# Patient Record
Sex: Male | Born: 1995 | Race: White | Hispanic: No | Marital: Single | Smoking: Never smoker
Health system: Southern US, Community
[De-identification: ages and names within clinical notes are randomized; demographics above are authoritative.]

## PROBLEM LIST (undated history)

## (undated) DIAGNOSIS — F419 Anxiety disorder, unspecified: Secondary | ICD-10-CM

## (undated) DIAGNOSIS — M549 Dorsalgia, unspecified: Secondary | ICD-10-CM

## (undated) DIAGNOSIS — F1910 Other psychoactive substance abuse, uncomplicated: Secondary | ICD-10-CM

## (undated) HISTORY — DX: Dorsalgia, unspecified: M54.9

## (undated) HISTORY — PX: FRACTURE SURGERY: SHX138

## (undated) HISTORY — PX: FOOT SURGERY: SHX648

## (undated) HISTORY — PX: WRIST FRACTURE SURGERY: SHX121

## (undated) HISTORY — DX: Other psychoactive substance abuse, uncomplicated: F19.10

## (undated) HISTORY — DX: Anxiety disorder, unspecified: F41.9

---
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
## 2015-06-25 ENCOUNTER — Encounter: Payer: Self-pay | Admitting: Neurology

## 2015-06-25 ENCOUNTER — Ambulatory Visit (INDEPENDENT_AMBULATORY_CARE_PROVIDER_SITE_OTHER): Admitting: Neurology

## 2015-06-25 VITALS — BP 134/76 | HR 98 | Resp 16 | Ht 71.0 in | Wt 178.0 lb

## 2015-06-25 DIAGNOSIS — G4710 Hypersomnia, unspecified: Secondary | ICD-10-CM | POA: Diagnosis not present

## 2015-06-25 DIAGNOSIS — G2581 Restless legs syndrome: Secondary | ICD-10-CM | POA: Diagnosis not present

## 2015-06-25 DIAGNOSIS — G478 Other sleep disorders: Secondary | ICD-10-CM
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
## 2015-06-25 NOTE — Patient Instructions (Signed)
We will do sleep study testing including a daytime sleep study.  On the day of your night time sleep study, please come between 8 and 5 for your urine drug test.  Please do not drive when sleepy.  Please remember to try to maintain good sleep hygiene, which means: Keep a regular sleep and wake schedule, try not to exercise or have a meal within 2 hours of your bedtime, try to keep your bedroom conducive for sleep, that is, cool and dark, without light distractors such as an illuminated alarm clock, and refrain from watching TV right before sleep or in the middle of the night and do not keep the TV or radio on during the night. Also, try not to use or play on electronic devices at bedtime, such as your cell phone, tablet PC or laptop. If you like to read at bedtime on an electronic device, try to dim the background light as much as possible. Do not eat in the middle of the night.

## 2015-07-08 ENCOUNTER — Other Ambulatory Visit: Payer: Self-pay

## 2015-07-08 DIAGNOSIS — G478 Other sleep disorders: Secondary | ICD-10-CM

## 2015-07-08 DIAGNOSIS — G4710 Hypersomnia, unspecified: Secondary | ICD-10-CM

## 2015-07-08 DIAGNOSIS — G2581 Restless legs syndrome: Secondary | ICD-10-CM

## 2015-11-07 ENCOUNTER — Encounter (HOSPITAL_COMMUNITY): Payer: Self-pay | Admitting: Physical Medicine and Rehabilitation

## 2015-11-07 ENCOUNTER — Emergency Department (HOSPITAL_COMMUNITY)
Admission: EM | Admit: 2015-11-07 | Discharge: 2015-11-07 | Disposition: A | Discharge status: Home / Self Care | Attending: Emergency Medicine | Admitting: Emergency Medicine

## 2015-11-07 DIAGNOSIS — G4700 Insomnia, unspecified: Secondary | ICD-10-CM | POA: Insufficient documentation

## 2015-11-07 DIAGNOSIS — F11282 Opioid dependence with opioid-induced sleep disorder: Secondary | ICD-10-CM | POA: Insufficient documentation

## 2015-11-07 DIAGNOSIS — R55 Syncope and collapse: Secondary | ICD-10-CM | POA: Diagnosis not present

## 2015-11-07 DIAGNOSIS — F1910 Other psychoactive substance abuse, uncomplicated: Secondary | ICD-10-CM

## 2015-11-07 DIAGNOSIS — G4710 Hypersomnia, unspecified: Secondary | ICD-10-CM

## 2015-11-07 DIAGNOSIS — R4020 Unspecified coma: Secondary | ICD-10-CM

## 2015-11-07 LAB — CBC WITH DIFFERENTIAL/PLATELET
Basophils Absolute: 0 10*3/uL (ref 0.0–0.1)
Basophils Relative: 0 %
EOS ABS: 0.1 10*3/uL (ref 0.0–0.7)
EOS PCT: 1 %
HCT: 46.8 % (ref 39.0–52.0)
Hemoglobin: 15.9 g/dL (ref 13.0–17.0)
LYMPHS ABS: 1.6 10*3/uL (ref 0.7–4.0)
LYMPHS PCT: 15 %
MCH: 29.9 pg (ref 26.0–34.0)
MCHC: 34 g/dL (ref 30.0–36.0)
MCV: 88 fL (ref 78.0–100.0)
MONO ABS: 0.4 10*3/uL (ref 0.1–1.0)
MONOS PCT: 4 %
Neutro Abs: 8.9 10*3/uL — ABNORMAL HIGH (ref 1.7–7.7)
Neutrophils Relative %: 80 %
PLATELETS: 314 10*3/uL (ref 150–400)
RBC: 5.32 MIL/uL (ref 4.22–5.81)
RDW: 12.7 % (ref 11.5–15.5)
WBC: 11 10*3/uL — AB (ref 4.0–10.5)

## 2015-11-07 LAB — COMPREHENSIVE METABOLIC PANEL
ALT: 24 U/L (ref 17–63)
ANION GAP: 9 (ref 5–15)
AST: 21 U/L (ref 15–41)
Albumin: 4.4 g/dL (ref 3.5–5.0)
Alkaline Phosphatase: 61 U/L (ref 38–126)
BUN: 10 mg/dL (ref 6–20)
CHLORIDE: 101 mmol/L (ref 101–111)
CO2: 27 mmol/L (ref 22–32)
CREATININE: 0.79 mg/dL (ref 0.61–1.24)
Calcium: 9.7 mg/dL (ref 8.9–10.3)
Glucose, Bld: 93 mg/dL (ref 65–99)
Potassium: 3.9 mmol/L (ref 3.5–5.1)
SODIUM: 137 mmol/L (ref 135–145)
Total Bilirubin: 1.3 mg/dL — ABNORMAL HIGH (ref 0.3–1.2)
Total Protein: 7.7 g/dL (ref 6.5–8.1)

## 2015-11-07 LAB — I-STAT TROPONIN, ED
TROPONIN I, POC: 0 ng/mL (ref 0.00–0.08)
TROPONIN I, POC: 0 ng/mL (ref 0.00–0.08)

## 2015-11-07 LAB — RAPID URINE DRUG SCREEN, HOSP PERFORMED
Amphetamines: NOT DETECTED
BARBITURATES: NOT DETECTED
BENZODIAZEPINES: POSITIVE — AB
COCAINE: NOT DETECTED
Opiates: POSITIVE — AB
Tetrahydrocannabinol: POSITIVE — AB

## 2015-11-07 LAB — ETHANOL

## 2015-11-07 MED ORDER — SODIUM CHLORIDE 0.9 % IV BOLUS (SEPSIS)
1000.0000 mL | Freq: Once | INTRAVENOUS | Status: AC
  Administered 2015-11-07: 1000 mL via INTRAVENOUS
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
## 2015-11-07 NOTE — Discharge Instructions (Signed)
Please do not drive until you are cleared by neurology.   Hypersomnia Hypersomnia is when you feel extremely tired during the day even though you're getting plenty of sleep at night. You may need to take naps during the day, and you may also be extremely difficult to wake up when you are sleeping.  CAUSES  The cause of your hypersomnia may not be known. Hypersomnia may be caused by:   Medicines.  Sleep disorders, such as narcolepsy.  Trauma or injury to your head or nervous system.  Using drugs or alcohol.  Tumors.  Medical conditions, such as depression or hypothyroidism.  Genetics. SIGNS AND SYMPTOMS  The main symptoms of hypersomnia include:   Feeling extremely tired throughout the day.  Being very difficult to wake up.  Sleeping for longer and longer periods.  Taking naps throughout the day. Other symptoms may include:   Feeling:  Restless.  Annoyed.  Anxious.  Low energy.  Having difficulty:  Remembering.  Speaking.  Thinking.  Losing your appetite.  Experiencing hallucinations. DIAGNOSIS  Hypersomnia may be diagnosed by:  Medical history and physical exam. This will include a sleep history.  Completing sleep logs.  Tests may also be done, such as:  Polysomnography.  Multiple sleep latency test (MSLT). TREATMENT  There is no cure for hypersomnia, but treatment can be very effective in helping manage the condition. Treatment may include:  Lifestyle and sleeping strategies to help cope with the condition.  Stimulant medicines.  Treating any underlying causes of hypersomnia. HOME CARE INSTRUCTIONS  Take medicines only as directed by your health care provider.  Schedule short naps for when you feel sleepiest during the day. Tell your employer or teachers that you have hypersomnia. You may be able to adjust your schedule to include time for naps.  Avoid drinking alcohol or caffeinated beverages.  Do not eat a heavy meal before bedtime.  Eat at about the same times every day.  Do not drive or operate heavy machinery if you are sleepy.  Do not swim or go out on the water without a life jacket.  If possible, adjust your schedule so that you do not have to work or be active at night.  Keep all follow-up visits as directed by your health care provider. This is important. SEEK MEDICAL CARE IF:   You have new symptoms.  Your symptoms get worse. SEEK IMMEDIATE MEDICAL CARE IF:  You have serious thoughts of hurting yourself or someone else.   This information is not intended to replace advice given to you by your health care provider. Make sure you discuss any questions you have with your health care provider.   Document Released: 04/09/2002 Document Revised: 05/10/2014 Document Reviewed: 11/22/2013 Elsevier Interactive Patient Education [employer]

## 2015-11-07 NOTE — ED Notes (Addendum)
Pt states he passed out at work this morning. Doesn't remember exact events. Pt is alert and oriented x4 at the time. No injuries noted. No neurological deficits noted.

## 2015-11-10 ENCOUNTER — Ambulatory Visit (INDEPENDENT_AMBULATORY_CARE_PROVIDER_SITE_OTHER)

## 2015-11-10 ENCOUNTER — Ambulatory Visit (INDEPENDENT_AMBULATORY_CARE_PROVIDER_SITE_OTHER): Admitting: Emergency Medicine

## 2015-11-10 ENCOUNTER — Encounter: Payer: Self-pay | Admitting: Neurology

## 2015-11-10 VITALS — BP 116/68 | HR 106 | Temp 98.1°F | Resp 17 | Ht 70.5 in | Wt 195.0 lb

## 2015-11-10 DIAGNOSIS — R55 Syncope and collapse: Secondary | ICD-10-CM

## 2015-11-10 DIAGNOSIS — R0789 Other chest pain: Secondary | ICD-10-CM

## 2015-11-10 DIAGNOSIS — F1910 Other psychoactive substance abuse, uncomplicated: Secondary | ICD-10-CM | POA: Insufficient documentation

## 2015-11-10 DIAGNOSIS — R079 Chest pain, unspecified: Secondary | ICD-10-CM

## 2015-11-10 LAB — POCT CBC
GRANULOCYTE PERCENT: 79.7 % (ref 37–80)
HEMATOCRIT: 44.6 % (ref 43.5–53.7)
Hemoglobin: 15.7 g/dL (ref 14.1–18.1)
Lymph, poc: 1.6 (ref 0.6–3.4)
MCH, POC: 30.6 pg (ref 27–31.2)
MCHC: 35.2 g/dL (ref 31.8–35.4)
MCV: 86.8 fL (ref 80–97)
MID (CBC): 0.5 (ref 0–0.9)
MPV: 6.6 fL (ref 0–99.8)
PLATELET COUNT, POC: 322 10*3/uL (ref 142–424)
POC Granulocyte: 8 — AB (ref 2–6.9)
POC LYMPH %: 15.6 % (ref 10–50)
POC MID %: 4.7 %M (ref 0–12)
RBC: 5.14 M/uL (ref 4.69–6.13)
RDW, POC: 13.1 %
WBC: 10.1 10*3/uL (ref 4.6–10.2)

## 2015-11-10 LAB — GLUCOSE, POCT (MANUAL RESULT ENTRY): POC Glucose: 95 mg/dl (ref 70–99)
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
## 2015-11-13 ENCOUNTER — Encounter: Payer: Self-pay | Admitting: Neurology

## 2015-11-13 ENCOUNTER — Ambulatory Visit (INDEPENDENT_AMBULATORY_CARE_PROVIDER_SITE_OTHER): Admitting: Neurology

## 2015-11-13 VITALS — BP 121/71 | HR 94 | Wt 195.5 lb

## 2015-11-13 DIAGNOSIS — G4710 Hypersomnia, unspecified: Secondary | ICD-10-CM | POA: Diagnosis not present

## 2015-11-13 DIAGNOSIS — F1910 Other psychoactive substance abuse, uncomplicated: Secondary | ICD-10-CM | POA: Diagnosis not present

## 2015-11-13 DIAGNOSIS — R55 Syncope and collapse: Secondary | ICD-10-CM | POA: Diagnosis not present

## 2015-11-13 DIAGNOSIS — R251 Tremor, unspecified: Secondary | ICD-10-CM | POA: Diagnosis not present

## 2015-11-13 NOTE — Patient Instructions (Signed)
Your exam looks, except for mild tremors.   We will do a brain scan, called MRI and call you with the test results. We will have to schedule you for this on a separate date. This test requires authorization from your insurance, and we will take care of the insurance process.  We will do an EEG (brainwave test), which we will schedule. We will call you with the results.  I would recommend you work with the [facility] and Dr. Cleta Albertsaub regarding your drug cessation and coming off of the 2 prescription medications too: xanax and effexor, in particular, for future sleep study testing.   I will see you back in about 3-4 months.
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
## 2015-11-15 LAB — PAIN MGMT, PROF 8 CONF W/O MM, U
6 Acetylmorphine: NEGATIVE ng/mL (ref <10)
ALCOHOL METABOLITES: NEGATIVE ng/mL (ref <500)
ALPHAHYDROXYALPRAZOLAM: 66 ng/mL — AB (ref <25)
ALPHAHYDROXYMIDAZOLAM: NEGATIVE ng/mL (ref <50)
ALPHAHYDROXYTRIAZOLAM: NEGATIVE ng/mL (ref <50)
Aminoclonazepam: NEGATIVE ng/mL (ref <25)
Amphetamines: NEGATIVE ng/mL (ref <500)
BENZODIAZEPINES: POSITIVE ng/mL — AB (ref <100)
Buprenorphine: NEGATIVE ng/mL (ref <5)
COCAINE METABOLITE: NEGATIVE ng/mL (ref <150)
Codeine: NEGATIVE ng/mL (ref <50)
Creatinine: 11.8 mg/dL — ABNORMAL LOW (ref >=20.0)
HYDROCODONE: NEGATIVE ng/mL (ref <50)
Hydromorphone: NEGATIVE ng/mL (ref <50)
Hydroxyethylflurazepam: NEGATIVE ng/mL (ref <50)
Lorazepam: NEGATIVE ng/mL (ref <50)
MARIJUANA METABOLITE: POSITIVE ng/mL — AB (ref <20)
MDMA: NEGATIVE ng/mL (ref <500)
Marijuana Metabolite: 97 ng/mL — ABNORMAL HIGH (ref <5)
Morphine: 106 ng/mL — ABNORMAL HIGH (ref <50)
NORHYDROCODONE: NEGATIVE ng/mL (ref <50)
Nordiazepam: NEGATIVE ng/mL (ref <50)
OPIATES: POSITIVE ng/mL — AB (ref <100)
OXYCODONE: NEGATIVE ng/mL (ref <100)
Oxazepam: NEGATIVE ng/mL (ref <50)
Oxidant: NEGATIVE ug/mL (ref <200)
PLEASE NOTE: 0
Specific Gravity: 1.003 (ref >=1.003)
TEMAZEPAM: NEGATIVE ng/mL (ref <50)
pH: 6.81 (ref 4.5–9.0)

## 2015-11-26 ENCOUNTER — Other Ambulatory Visit

## 2015-12-17 ENCOUNTER — Ambulatory Visit (INDEPENDENT_AMBULATORY_CARE_PROVIDER_SITE_OTHER): Admitting: Neurology

## 2015-12-17 DIAGNOSIS — R251 Tremor, unspecified: Secondary | ICD-10-CM

## 2015-12-17 DIAGNOSIS — R55 Syncope and collapse: Secondary | ICD-10-CM | POA: Diagnosis not present

## 2015-12-17 DIAGNOSIS — G4710 Hypersomnia, unspecified: Secondary | ICD-10-CM

## 2015-12-17 DIAGNOSIS — F1910 Other psychoactive substance abuse, uncomplicated: Secondary | ICD-10-CM

## 2015-12-17 NOTE — Progress Notes (Signed)
Please call and advise the patient that the EEG or brain wave test we performed was reported as normal in the awake state. We checked for abnormal electrical discharges in the brain waves and the report suggested normal findings. No further action is required on this test at this time. Please remind patient to keep any upcoming appointments or tests and to call us with any interim questions, concerns, problems or updates. Thanks,  Giuseppina Quinones, MD, PhD  

## 2015-12-17 NOTE — Procedures (Signed)
    History:  Christian Mullen is a 20 year old gentleman with a history of polysubstance abuse. The patient was recently seen in the emergency room with an episode of loss of consciousness. The patient has a history of opiate, benzodiazepine and marijuana abuse. The patient is being evaluated for the syncopal event.  This is a routine EEG. No skull defects are noted. Medications include Xanax and Effexor.   EEG classification: Normal awake  Description of the recording: The background rhythms of this recording consists of a fairly well modulated medium amplitude alpha rhythm of 9 Hz that is reactive to eye opening and closure. As the record progresses, the patient appears to remain in the waking state throughout the recording. Photic stimulation was performed, resulting in a bilateral and symmetric photic driving response. Hyperventilation was also performed, resulting in a minimal buildup of the background rhythm activities without significant slowing seen. At no time during the recording does there appear to be evidence of spike or spike wave discharges or evidence of focal slowing. EKG monitor shows no evidence of cardiac rhythm abnormalities with a heart rate of 78.  Impression: This is a normal EEG recording in the waking state. No evidence of ictal or interictal discharges are seen.

## 2015-12-22 ENCOUNTER — Telehealth: Payer: Self-pay

## 2015-12-22 NOTE — Telephone Encounter (Signed)
LM for patient to call back.

## 2015-12-22 NOTE — Telephone Encounter (Signed)
-----   Message from Huston FoleySaima Athar, MD sent at 12/17/2015  6:24 PM EDT ----- Please call and advise the patient that the EEG or brain wave test we performed was reported as normal in the awake state. We checked for abnormal electrical discharges in the brain waves and the report suggested normal findings. No further action is required on this test at this time. Please remind patient to keep any upcoming appointments or tests and to call us with any interim questions, concerns, problems or updates. Thanks,  Huston FoleySaima Athar, MD, PhD

## 2015-12-25 NOTE — Telephone Encounter (Signed)
LM per DPR with results and recommendations. Left call back number for further questions.

## 2016-03-30 ENCOUNTER — Telehealth: Payer: Self-pay

## 2016-03-30 ENCOUNTER — Ambulatory Visit: Admitting: Neurology

## 2016-03-30 NOTE — Telephone Encounter (Signed)
Patient did not show to appt today  

## 2016-04-01 ENCOUNTER — Encounter: Payer: Self-pay | Admitting: Neurology
